# Patient Record
Sex: Male | Born: 1970 | Race: Black or African American | Hispanic: No | Marital: Married | Smoking: Light tobacco smoker
Health system: Southern US, Community
[De-identification: ages and names within clinical notes are randomized; demographics above are authoritative.]

## PROBLEM LIST (undated history)

## (undated) DIAGNOSIS — J45909 Unspecified asthma, uncomplicated: Secondary | ICD-10-CM

## (undated) DIAGNOSIS — I493 Ventricular premature depolarization: Secondary | ICD-10-CM

## (undated) DIAGNOSIS — I10 Essential (primary) hypertension: Secondary | ICD-10-CM

## (undated) DIAGNOSIS — R809 Proteinuria, unspecified: Secondary | ICD-10-CM

## (undated) HISTORY — DX: Unspecified asthma, uncomplicated: J45.909

## (undated) HISTORY — DX: Ventricular premature depolarization: I49.3

## (undated) HISTORY — DX: Essential (primary) hypertension: I10

## (undated) HISTORY — DX: Proteinuria, unspecified: R80.9

---

## 2002-09-06 ENCOUNTER — Encounter: Payer: Self-pay | Admitting: *Deleted

## 2002-09-06 ENCOUNTER — Emergency Department (HOSPITAL_COMMUNITY): Admission: EM | Admit: 2002-09-06 | Discharge: 2002-09-06 | Payer: Self-pay | Admitting: Emergency Medicine

## 2005-10-01 ENCOUNTER — Emergency Department (HOSPITAL_COMMUNITY): Admission: EM | Admit: 2005-10-01 | Discharge: 2005-10-01 | Payer: Self-pay | Admitting: Emergency Medicine

## 2014-10-16 ENCOUNTER — Other Ambulatory Visit: Payer: Self-pay | Admitting: Nephrology

## 2014-10-16 DIAGNOSIS — I159 Secondary hypertension, unspecified: Secondary | ICD-10-CM

## 2014-10-16 DIAGNOSIS — R809 Proteinuria, unspecified: Secondary | ICD-10-CM

## 2014-10-16 DIAGNOSIS — N182 Chronic kidney disease, stage 2 (mild): Secondary | ICD-10-CM

## 2014-10-27 ENCOUNTER — Other Ambulatory Visit: Payer: Self-pay

## 2014-11-03 ENCOUNTER — Ambulatory Visit
Admission: RE | Admit: 2014-11-03 | Discharge: 2014-11-03 | Disposition: A | Discharge status: Home / Self Care | Payer: BLUE CROSS/BLUE SHIELD | Source: Ambulatory Visit | Attending: Nephrology | Admitting: Nephrology

## 2014-11-03 DIAGNOSIS — R809 Proteinuria, unspecified: Secondary | ICD-10-CM

## 2014-11-03 DIAGNOSIS — N182 Chronic kidney disease, stage 2 (mild): Secondary | ICD-10-CM

## 2014-11-03 DIAGNOSIS — I159 Secondary hypertension, unspecified: Secondary | ICD-10-CM

## 2015-10-21 DIAGNOSIS — R809 Proteinuria, unspecified: Secondary | ICD-10-CM | POA: Diagnosis not present

## 2015-10-21 DIAGNOSIS — I10 Essential (primary) hypertension: Secondary | ICD-10-CM | POA: Diagnosis not present

## 2015-10-21 DIAGNOSIS — N182 Chronic kidney disease, stage 2 (mild): Secondary | ICD-10-CM | POA: Diagnosis not present

## 2015-10-26 DIAGNOSIS — Z13220 Encounter for screening for lipoid disorders: Secondary | ICD-10-CM | POA: Diagnosis not present

## 2015-10-26 DIAGNOSIS — J4520 Mild intermittent asthma, uncomplicated: Secondary | ICD-10-CM | POA: Diagnosis not present

## 2015-10-26 DIAGNOSIS — Z131 Encounter for screening for diabetes mellitus: Secondary | ICD-10-CM | POA: Diagnosis not present

## 2015-10-26 DIAGNOSIS — I493 Ventricular premature depolarization: Secondary | ICD-10-CM | POA: Diagnosis not present

## 2015-10-26 DIAGNOSIS — I10 Essential (primary) hypertension: Secondary | ICD-10-CM | POA: Diagnosis not present

## 2015-10-26 DIAGNOSIS — Z0000 Encounter for general adult medical examination without abnormal findings: Secondary | ICD-10-CM | POA: Diagnosis not present

## 2015-11-03 ENCOUNTER — Telehealth: Payer: Self-pay | Admitting: Cardiology

## 2015-11-03 NOTE — Telephone Encounter (Signed)
Received records from [facility] for appointment on 11/11/15 with Dr Herbie BaltimoreHarding.  [facility] for Dr Elissa HeftyHarding's schedule on 11/11/15.

## 2015-11-11 ENCOUNTER — Encounter: Payer: Self-pay | Admitting: Cardiology

## 2015-11-11 ENCOUNTER — Ambulatory Visit (INDEPENDENT_AMBULATORY_CARE_PROVIDER_SITE_OTHER): Payer: BLUE CROSS/BLUE SHIELD | Admitting: Cardiology

## 2015-11-11 VITALS — BP 110/86 | HR 72 | Ht 68.0 in | Wt 211.8 lb

## 2015-11-11 DIAGNOSIS — I10 Essential (primary) hypertension: Secondary | ICD-10-CM

## 2015-11-11 DIAGNOSIS — I493 Ventricular premature depolarization: Secondary | ICD-10-CM

## 2015-11-11 DIAGNOSIS — R9431 Abnormal electrocardiogram [ECG] [EKG]: Secondary | ICD-10-CM | POA: Diagnosis not present

## 2015-11-11 NOTE — Patient Instructions (Signed)
NO CHANGE WITH CURRENT MEDICATIONS    Your physician has requested that you have an echocardiogram AT [address]. Echocardiography is a painless test that uses sound waves to create images of your heart. It provides your doctor with information about the size and shape of your heart and how well your heart's chambers and valves are working. This procedure takes approximately one hour. There are no restrictions for this procedure.   IF TEST IS NORMAL- APPOINTMENT AS NEEDED.  Your physician recommends that you schedule a follow-up appointment in AFTER ECHO IS COMPLETED.

## 2015-11-11 NOTE — Progress Notes (Signed)
PCP: Pcp Not In System; Dr. Docia Chuck Alfredo Bach)  Clinic Note: Chief Complaint  Patient presents with  . New Evaluation    Consultation for PVCs    HPI: Shawn Peterson is a 45 y.o. male with a PMH below who presents today for Evaluation of a symptomatically PVC's.Shawn Peterson was seen on May 16 by his PCP team follow-up. He is noted to have a PVC on his EKG.  Recent Hospitalizations: None  Studies Reviewed: None  Interval History: Shawn Peterson is a very healthy young appearing gentleman who has a history of hypertension on single medication as well as asthma on Ventolin. He usually works out extensively, but because work is not able to exercise as much she used to be. He usually lifts weights and plays tennis. Despite that he is denies any cardiac symptoms of chest tightness pressure dyspnea. He denies any sensation of palpitations or irregular heartbeats. No PND orthopnea or edema. No syncope/near syncope or amaurosis fugax.   He indicates that he really is not sure why he is here.  No claudication.  ROS: A comprehensive was performed. Review of Systems  Constitutional: Negative for fever, chills and malaise/fatigue.  HENT: Negative for nosebleeds.   Respiratory: Negative for cough, shortness of breath and wheezing.   Cardiovascular: Negative for palpitations.  Gastrointestinal: Negative for abdominal pain, blood in stool and melena.  Genitourinary:       Has history of proteinuria. Is on lisinopril  Musculoskeletal: Negative for myalgias, joint pain and falls.  Neurological: Negative for dizziness, loss of consciousness and headaches.  Endo/Heme/Allergies: Does not bruise/bleed easily.  Psychiatric/Behavioral: Negative.  Negative for memory loss.  All other systems reviewed and are negative.   Past Medical History  Diagnosis Date  . Hypertension   . PVC (premature ventricular contraction)   . Asthma   . Proteinuria     Followed by Dr. Kathrene Bongo from  nephrology    History reviewed. No pertinent past surgical history.  Prior to Admission medications   Medication Sig Start Date End Date Taking? Authorizing Provider  lisinopril-hydrochlorothiazide (PRINZIDE,ZESTORETIC) 10-12.5 MG tablet Take 1 tablet by mouth daily. 10/17/15   Historical Provider, MD  VENTOLIN HFA 108 (90 Base) MCG/ACT inhaler Inhale 1 puff into the lungs as needed. 10/17/15   Historical Provider, MD   No Known Allergies   Social History   Social History  . Marital Status: Married    Spouse Name: N/A  . Number of Children: N/A  . Years of Education: N/A   Social History Main Topics  . Smoking status: Former Games developer  . Smokeless tobacco: None  . Alcohol Use: None  . Drug Use: None  . Sexual Activity: Not Asked   Other Topics Concern  . None   Social History Narrative   family history includes Asthma in his father; Heart attack in his maternal grandmother; Hypertension in his mother; Pancreatic cancer in his paternal grandmother; Prostate cancer in his maternal grandfather.   Wt Readings from Last 3 Encounters:  11/11/15 211 lb 12.8 oz (96.072 kg)    PHYSICAL EXAM BP 110/86 mmHg  Pulse 72  Ht  (1.727 m)  Wt 211 lb 12.8 oz (96.072 kg)  BMI 32.21 kg/m2  SpO2 96% General appearance: alert, cooperative, appears stated age, no distress and Very healthy appearing. Muscular - BMI does not indicate obesity HEENT: [place]/AT, EOMI, MMM, anicteric sclera Neck: no adenopathy, no carotid bruit and no JVD Lungs: clear to auscultation bilaterally, normal percussion bilaterally and  non-labored Heart: RRR with occasional ectopy, S1&S2 normal, no murmur, click, rub or gallop; nondisplaced PMI Abdomen: soft, non-tender; bowel sounds normal; no masses,  no organomegaly; no HJR Extremities: extremities normal, atraumatic, no cyanosis, or edema Pulses: 2+ and symmetric;  Skin: mobility and turgor normal and no lesions noted  Neurologic: Mental status: Alert, oriented,  thought content appropriate Cranial nerves: normal (II-XII grossly intact)   Adult ECG Report  Rate: 72 ;  Rhythm: normal sinus rhythm and sinus arrhythmia; Early repolarization abnormality  Narrative Interpretation: Relatively normal EKG  Review of EKG from PCP: sinus rhythm with occasional PVC otherwise normal axis, intervals and durations  Other studies Reviewed: Additional studies/ records that were reviewed today include:  Recent Labs:  Labs from PCP reviewed Total cholesterol 185, HDL 50, LDL 116, TG 90.6     ASSESSMENT / PLAN: Problem List Items Addressed This Visit    PVCs (premature ventricular contractions) - Primary    A symptomatically PVCs. I heard maybe 1 or 2. Not noted on EKG. With no symptoms to suggest angina or heart failure related dyspnea, no indication for stress test. With him having hypertension to the extent of having proteinuria, we will check a 2-D echocardiogram to exclude structural abnormality.  Provided this is normal, he probably does not need to continue following up with cardiology. Would not use beta blocker unless he was manic.      Relevant Medications   lisinopril-hydrochlorothiazide (PRINZIDE,ZESTORETIC) 10-12.5 MG tablet   Other Relevant Orders   EKG 12-Lead   ECHOCARDIOGRAM COMPLETE   Essential hypertension (Chronic)    Well-controlled. Followed by PCP and nephrology      Relevant Medications   lisinopril-hydrochlorothiazide (PRINZIDE,ZESTORETIC) 10-12.5 MG tablet   Abnormal finding on EKG   Relevant Orders   EKG 12-Lead   ECHOCARDIOGRAM COMPLETE      Current medicines are reviewed at length with the patient today. (+/- concerns) none The following changes have been made: none Studies Ordered:   Orders Placed This Encounter  Procedures  . EKG 12-Lead  . ECHOCARDIOGRAM COMPLETE   IF TEST IS NORMAL- APPOINTMENT AS NEEDED.   Bryan Lemmaavid Harding, M.D., M.S. Interventional Cardiologist   Pager # [phone] Phone #  [phone] [address] [place], [place] [id number]

## 2015-11-13 ENCOUNTER — Encounter: Payer: Self-pay | Admitting: Cardiology

## 2015-11-13 DIAGNOSIS — I493 Ventricular premature depolarization: Secondary | ICD-10-CM | POA: Insufficient documentation

## 2015-11-13 DIAGNOSIS — I10 Essential (primary) hypertension: Secondary | ICD-10-CM | POA: Insufficient documentation

## 2015-11-13 DIAGNOSIS — R9431 Abnormal electrocardiogram [ECG] [EKG]: Secondary | ICD-10-CM | POA: Insufficient documentation

## 2015-11-13 NOTE — Assessment & Plan Note (Signed)
A symptomatically PVCs. I heard maybe 1 or 2. Not noted on EKG. With no symptoms to suggest angina or heart failure related dyspnea, no indication for stress test. With him having hypertension to the extent of having proteinuria, we will check a 2-D echocardiogram to exclude structural abnormality.  Provided this is normal, he probably does not need to continue following up with cardiology. Would not use beta blocker unless he was manic.

## 2015-11-13 NOTE — Assessment & Plan Note (Signed)
Well-controlled. Followed by PCP and nephrology

## 2015-12-02 ENCOUNTER — Other Ambulatory Visit: Payer: Self-pay

## 2015-12-02 ENCOUNTER — Ambulatory Visit (HOSPITAL_COMMUNITY): Payer: BLUE CROSS/BLUE SHIELD | Attending: Cardiology

## 2015-12-02 DIAGNOSIS — I10 Essential (primary) hypertension: Secondary | ICD-10-CM | POA: Diagnosis not present

## 2015-12-02 DIAGNOSIS — R9431 Abnormal electrocardiogram [ECG] [EKG]: Secondary | ICD-10-CM | POA: Diagnosis not present

## 2015-12-02 DIAGNOSIS — I493 Ventricular premature depolarization: Secondary | ICD-10-CM | POA: Diagnosis not present

## 2015-12-02 DIAGNOSIS — I77810 Thoracic aortic ectasia: Secondary | ICD-10-CM | POA: Diagnosis not present

## 2015-12-02 DIAGNOSIS — I059 Rheumatic mitral valve disease, unspecified: Secondary | ICD-10-CM | POA: Insufficient documentation

## 2015-12-02 DIAGNOSIS — Z87891 Personal history of nicotine dependence: Secondary | ICD-10-CM | POA: Insufficient documentation

## 2015-12-02 LAB — ECHOCARDIOGRAM COMPLETE
Ao-asc: 26 cm
E decel time: 176 msec
EERAT: 5.55
FS: 33 % (ref 28–44)
IVS/LV PW RATIO, ED: 0.89
LA ID, A-P, ES: 27 mm
LA diam end sys: 27 mm
LA diam index: 1.29 cm/m2
LA vol A4C: 48.4 ml
LA vol index: 28.6 mL/m2
LA vol: 59.7 mL
LV E/e'average: 5.55
LV e' LATERAL: 14.2 cm/s
LVEEMED: 5.55
LVOT SV: 73 mL
LVOT VTI: 23.1 cm
LVOT area: 3.14 cm2
LVOT diameter: 20 mm
LVOTPV: 107 cm/s
MV Dec: 176
MV Peak grad: 2 mmHg
MV pk A vel: 51.1 m/s
MV pk E vel: 78.8 m/s
PW: 8.98 mm — AB (ref 0.6–1.1)
Reg peak vel: 220 cm/s
TAPSE: 16.5 mm
TDI e' lateral: 14.2
TDI e' medial: 8.11
TR max vel: 220 cm/s

## 2015-12-10 ENCOUNTER — Telehealth: Payer: Self-pay | Admitting: *Deleted

## 2015-12-10 NOTE — Telephone Encounter (Signed)
LEFT MESSAGE TO CALL BACK

## 2015-12-10 NOTE — Telephone Encounter (Signed)
-----   Message from Marykay Lexavid W Harding, MD sent at 12/03/2015 12:22 AM EDT ----- Overall. The echocardiogram is relatively normal. The left ventricle function is on the low end of normal, but still normal. Probably showing signs of long-standing high blood pressure. Otherwise normal valve function. Normal right-sided pressures.  Overall good news.  Bryan Lemmaavid Harding, MD

## 2016-04-27 DIAGNOSIS — R809 Proteinuria, unspecified: Secondary | ICD-10-CM | POA: Diagnosis not present

## 2016-04-27 DIAGNOSIS — Z6831 Body mass index (BMI) 31.0-31.9, adult: Secondary | ICD-10-CM | POA: Diagnosis not present

## 2016-04-27 DIAGNOSIS — N182 Chronic kidney disease, stage 2 (mild): Secondary | ICD-10-CM | POA: Diagnosis not present

## 2016-04-27 DIAGNOSIS — I10 Essential (primary) hypertension: Secondary | ICD-10-CM | POA: Diagnosis not present

## 2016-05-01 IMAGING — US US RENAL
1 series · 14 of 25 positions shown · non-contrast
Comparison: 11/13/2006

CLINICAL DATA: Chronic renal disease and proteinuria

EXAM:
RENAL / URINARY TRACT ULTRASOUND COMPLETE

[Series 1: us renal · 0.23mm/px · 14 of 27 slices shown]
[im 1/27]
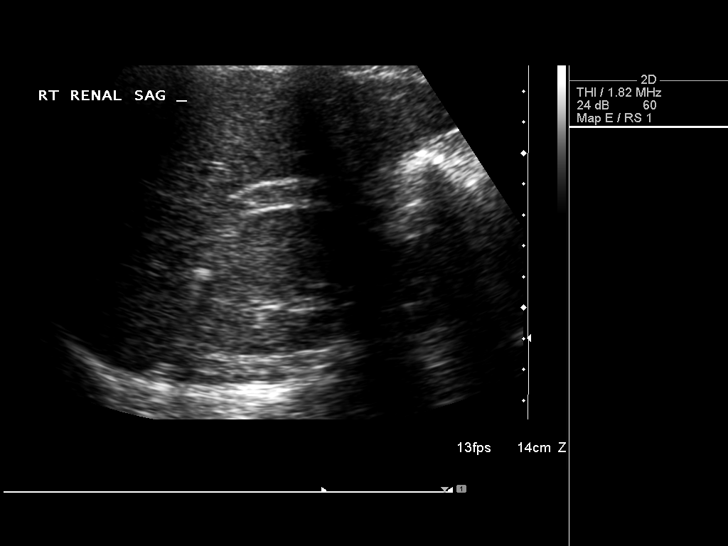
[im 3/27]
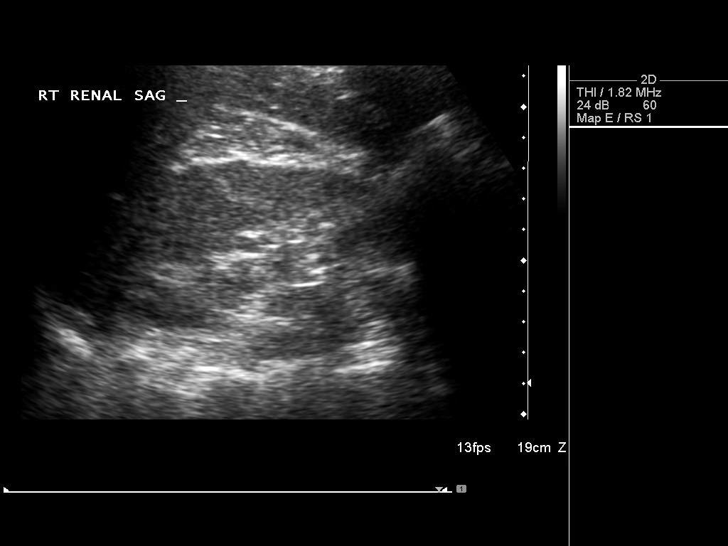
[im 5/27]
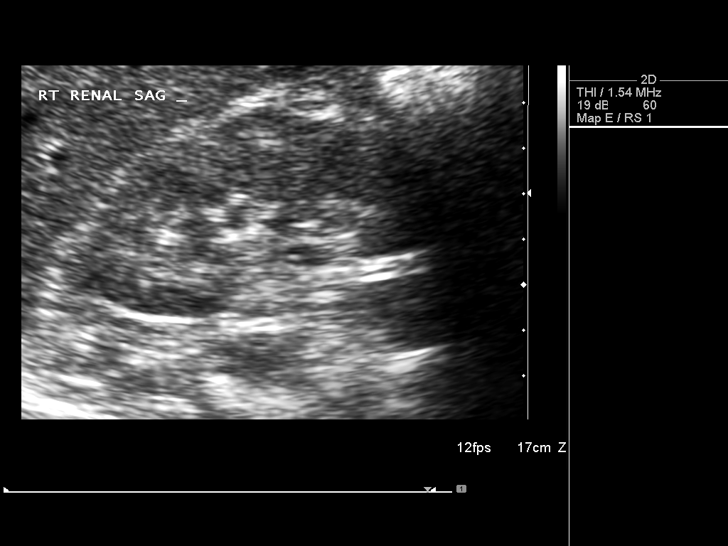
[im 7/27]
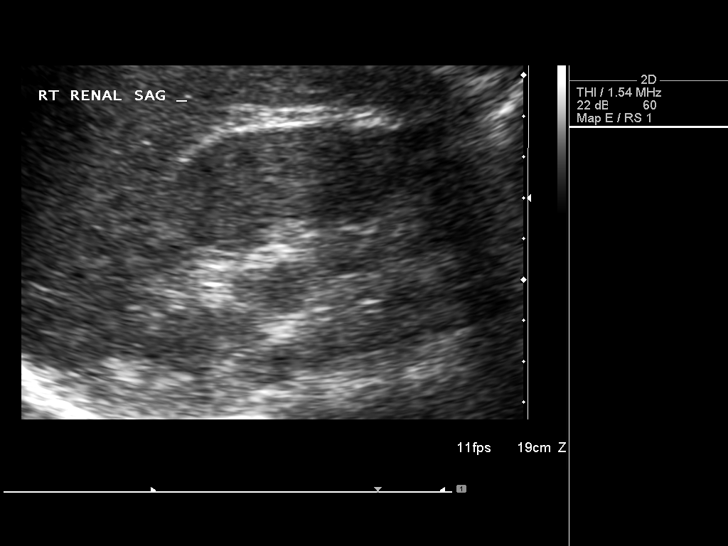
[im 9/27]
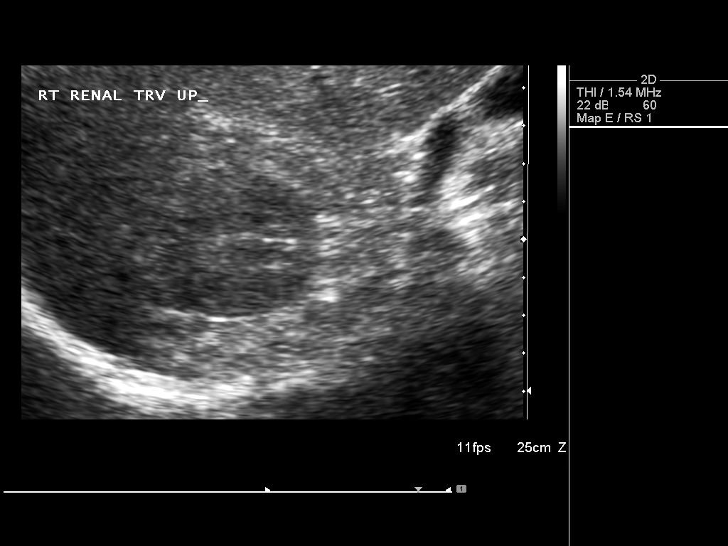
[im 10/27]
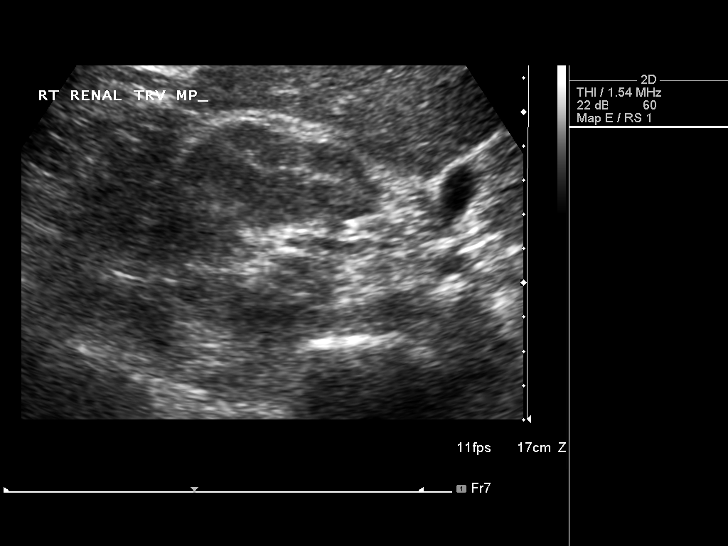
[im 12/27]
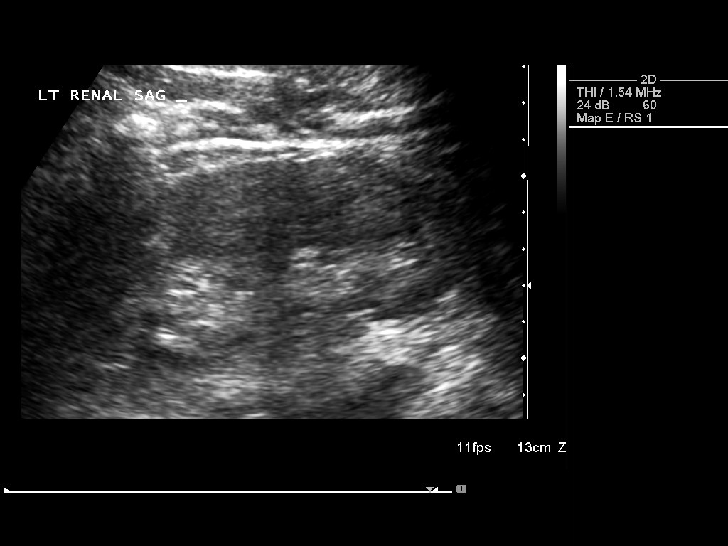
[im 15/27]
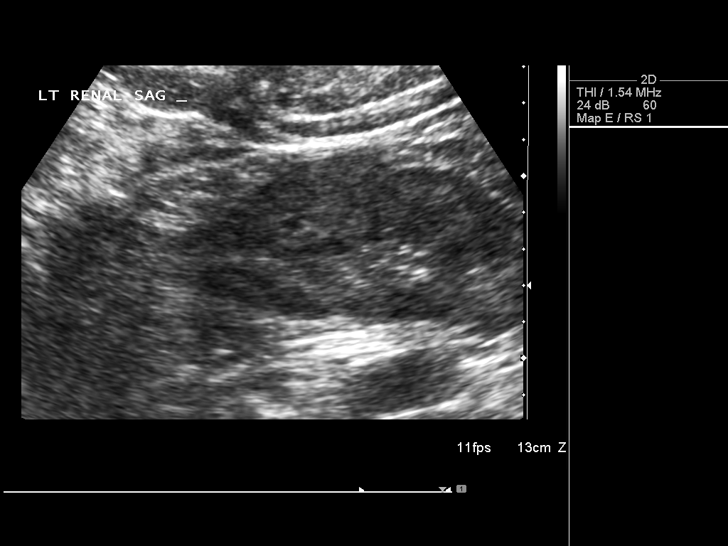
[im 17/27]
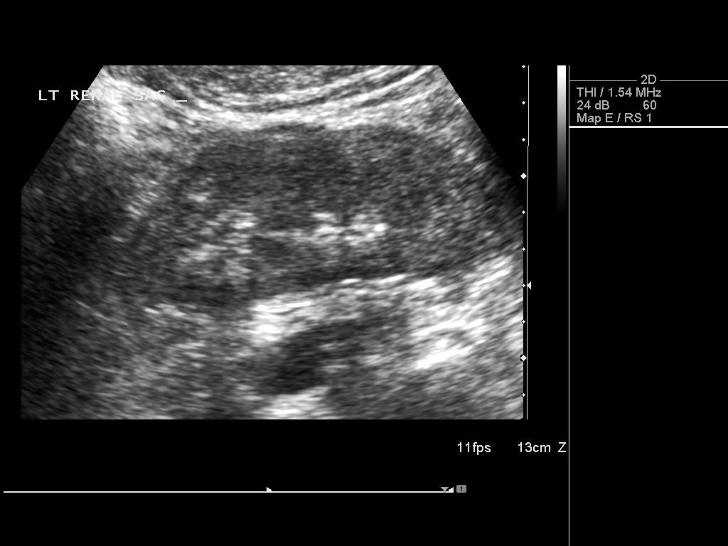
[im 18/27]
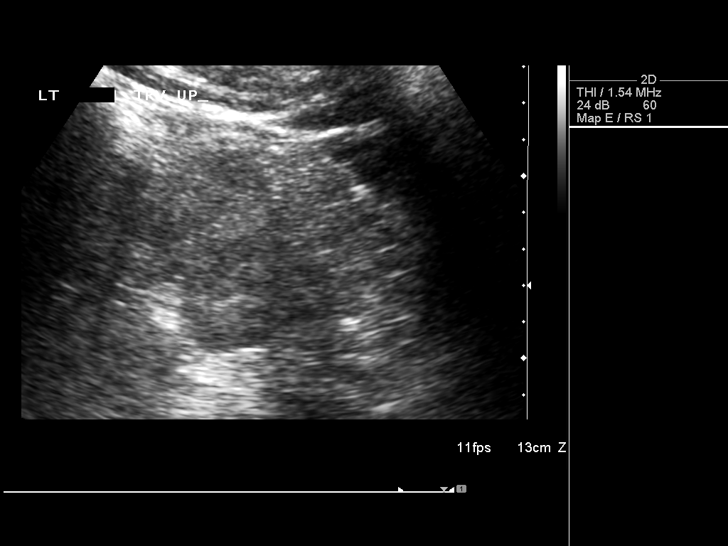
[im 20/27]
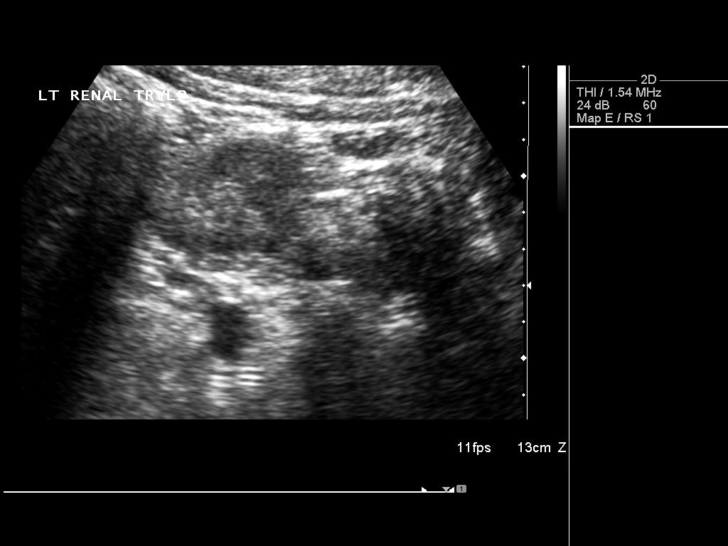
[im 22/27]
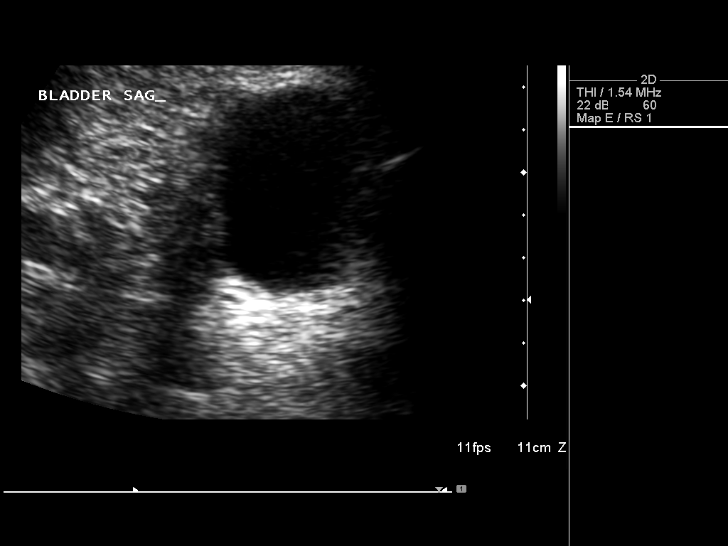
[im 24/27]
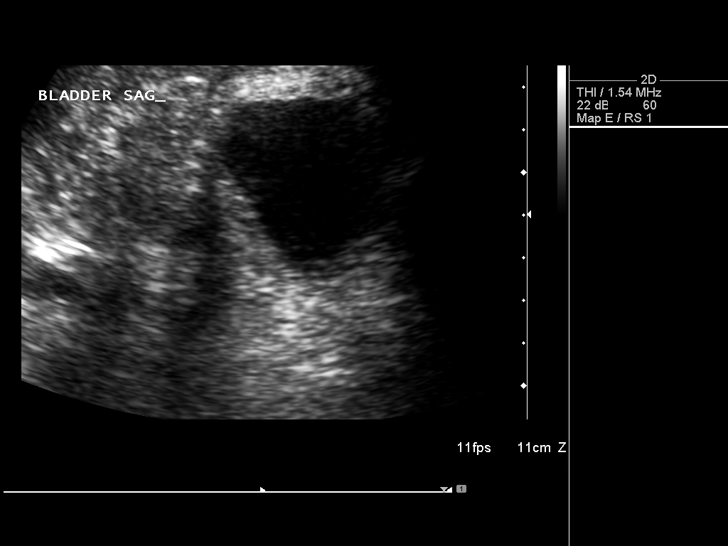
[im 27/27]
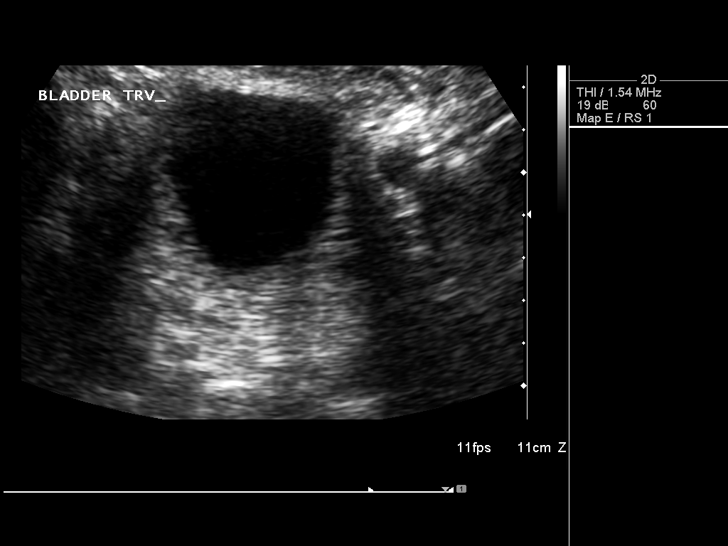

[14 of 25 positions shown; findings below may reference images not displayed]

FINDINGS: Right Kidney:

Length: 10.1 cm. Generalized increased echogenicity is noted. No
focal mass lesion or hydronephrosis is noted.

Left Kidney:

Length: 11.3 cm. Generalized increased echogenicity is noted. No
mass lesion or hydronephrosis is noted.

Bladder:

Appears normal for degree of bladder distention.
IMPRESSION: Increased echogenicity consistent with medical renal disease.

No acute abnormality noted.

## 2016-10-31 DIAGNOSIS — Z125 Encounter for screening for malignant neoplasm of prostate: Secondary | ICD-10-CM | POA: Diagnosis not present

## 2016-10-31 DIAGNOSIS — I10 Essential (primary) hypertension: Secondary | ICD-10-CM | POA: Diagnosis not present

## 2016-10-31 DIAGNOSIS — Z13220 Encounter for screening for lipoid disorders: Secondary | ICD-10-CM | POA: Diagnosis not present

## 2016-10-31 DIAGNOSIS — Z0000 Encounter for general adult medical examination without abnormal findings: Secondary | ICD-10-CM | POA: Diagnosis not present

## 2016-10-31 DIAGNOSIS — J4520 Mild intermittent asthma, uncomplicated: Secondary | ICD-10-CM | POA: Diagnosis not present

## 2016-11-09 DIAGNOSIS — Z6831 Body mass index (BMI) 31.0-31.9, adult: Secondary | ICD-10-CM | POA: Diagnosis not present

## 2016-11-09 DIAGNOSIS — N182 Chronic kidney disease, stage 2 (mild): Secondary | ICD-10-CM | POA: Diagnosis not present

## 2016-11-09 DIAGNOSIS — R809 Proteinuria, unspecified: Secondary | ICD-10-CM | POA: Diagnosis not present

## 2016-11-09 DIAGNOSIS — I10 Essential (primary) hypertension: Secondary | ICD-10-CM | POA: Diagnosis not present

## 2017-11-06 DIAGNOSIS — Z13220 Encounter for screening for lipoid disorders: Secondary | ICD-10-CM | POA: Diagnosis not present

## 2017-11-06 DIAGNOSIS — Z23 Encounter for immunization: Secondary | ICD-10-CM | POA: Diagnosis not present

## 2017-11-06 DIAGNOSIS — I493 Ventricular premature depolarization: Secondary | ICD-10-CM | POA: Diagnosis not present

## 2017-11-06 DIAGNOSIS — I10 Essential (primary) hypertension: Secondary | ICD-10-CM | POA: Diagnosis not present

## 2017-11-06 DIAGNOSIS — Z0000 Encounter for general adult medical examination without abnormal findings: Secondary | ICD-10-CM | POA: Diagnosis not present

## 2017-11-06 DIAGNOSIS — J4520 Mild intermittent asthma, uncomplicated: Secondary | ICD-10-CM | POA: Diagnosis not present

## 2017-11-06 DIAGNOSIS — Z125 Encounter for screening for malignant neoplasm of prostate: Secondary | ICD-10-CM | POA: Diagnosis not present

## 2017-11-22 DIAGNOSIS — N183 Chronic kidney disease, stage 3 (moderate): Secondary | ICD-10-CM | POA: Diagnosis not present

## 2017-11-27 DIAGNOSIS — R809 Proteinuria, unspecified: Secondary | ICD-10-CM | POA: Diagnosis not present

## 2017-11-27 DIAGNOSIS — I10 Essential (primary) hypertension: Secondary | ICD-10-CM | POA: Diagnosis not present

## 2017-11-27 DIAGNOSIS — Z6831 Body mass index (BMI) 31.0-31.9, adult: Secondary | ICD-10-CM | POA: Diagnosis not present

## 2017-11-27 DIAGNOSIS — N182 Chronic kidney disease, stage 2 (mild): Secondary | ICD-10-CM | POA: Diagnosis not present

## 2018-10-12 ENCOUNTER — Emergency Department (HOSPITAL_COMMUNITY)
Admission: EM | Admit: 2018-10-12 | Discharge: 2018-10-12 | Disposition: A | Discharge status: Home / Self Care | Payer: BLUE CROSS/BLUE SHIELD | Attending: Emergency Medicine | Admitting: Emergency Medicine

## 2018-10-12 ENCOUNTER — Telehealth (HOSPITAL_COMMUNITY): Payer: Self-pay

## 2018-10-12 ENCOUNTER — Encounter (HOSPITAL_COMMUNITY): Payer: Self-pay | Admitting: Emergency Medicine

## 2018-10-12 ENCOUNTER — Other Ambulatory Visit: Payer: Self-pay

## 2018-10-12 DIAGNOSIS — R197 Diarrhea, unspecified: Secondary | ICD-10-CM | POA: Insufficient documentation

## 2018-10-12 DIAGNOSIS — I10 Essential (primary) hypertension: Secondary | ICD-10-CM | POA: Insufficient documentation

## 2018-10-12 DIAGNOSIS — R42 Dizziness and giddiness: Secondary | ICD-10-CM | POA: Diagnosis not present

## 2018-10-12 DIAGNOSIS — R55 Syncope and collapse: Secondary | ICD-10-CM | POA: Insufficient documentation

## 2018-10-12 DIAGNOSIS — R112 Nausea with vomiting, unspecified: Secondary | ICD-10-CM | POA: Diagnosis not present

## 2018-10-12 DIAGNOSIS — R0902 Hypoxemia: Secondary | ICD-10-CM | POA: Diagnosis not present

## 2018-10-12 DIAGNOSIS — R7989 Other specified abnormal findings of blood chemistry: Secondary | ICD-10-CM

## 2018-10-12 DIAGNOSIS — F17290 Nicotine dependence, other tobacco product, uncomplicated: Secondary | ICD-10-CM | POA: Insufficient documentation

## 2018-10-12 DIAGNOSIS — R609 Edema, unspecified: Secondary | ICD-10-CM | POA: Diagnosis not present

## 2018-10-12 LAB — GASTROINTESTINAL PANEL BY PCR, STOOL (REPLACES STOOL CULTURE)

## 2018-10-12 LAB — BASIC METABOLIC PANEL
Anion gap: 11 (ref 5–15)
Anion gap: 9 (ref 5–15)
BUN: 22 mg/dL — ABNORMAL HIGH (ref 6–20)
BUN: 19 mg/dL (ref 6–20)
CO2: 17 mmol/L — ABNORMAL LOW (ref 22–32)
CO2: 19 mmol/L — ABNORMAL LOW (ref 22–32)
Calcium: 9.6 mg/dL (ref 8.9–10.3)
Calcium: 8.5 mg/dL — ABNORMAL LOW (ref 8.9–10.3)
Chloride: 109 mmol/L (ref 98–111)
Chloride: 110 mmol/L (ref 98–111)
Creatinine, Ser: 2.49 mg/dL — ABNORMAL HIGH (ref 0.61–1.24)
Creatinine, Ser: 2.08 mg/dL — ABNORMAL HIGH (ref 0.61–1.24)
GFR calc Af Amer: 34 mL/min — ABNORMAL LOW (ref >60)
GFR calc Af Amer: 43 mL/min — ABNORMAL LOW (ref >60)
GFR calc non Af Amer: 30 mL/min — ABNORMAL LOW (ref >60)
GFR calc non Af Amer: 37 mL/min — ABNORMAL LOW (ref >60)
Glucose, Bld: 122 mg/dL — ABNORMAL HIGH (ref 70–99)
Glucose, Bld: 106 mg/dL — ABNORMAL HIGH (ref 70–99)
Potassium: 5.3 mmol/L — ABNORMAL HIGH (ref 3.5–5.1)
Potassium: 5.2 mmol/L — ABNORMAL HIGH (ref 3.5–5.1)
Sodium: 137 mmol/L (ref 135–145)
Sodium: 138 mmol/L (ref 135–145)

## 2018-10-12 LAB — CBC WITH DIFFERENTIAL/PLATELET
Abs Immature Granulocytes: 0.09 10*3/uL — ABNORMAL HIGH (ref 0.00–0.07)
Basophils Absolute: 0 10*3/uL (ref 0.0–0.1)
Basophils Relative: 0 %
Eosinophils Absolute: 0 10*3/uL (ref 0.0–0.5)
Eosinophils Relative: 0 %
HCT: 45.4 % (ref 39.0–52.0)
Hemoglobin: 14.5 g/dL (ref 13.0–17.0)
Immature Granulocytes: 1 %
Lymphocytes Relative: 4 %
Lymphs Abs: 0.7 10*3/uL (ref 0.7–4.0)
MCH: 28.9 pg (ref 26.0–34.0)
MCHC: 31.9 g/dL (ref 30.0–36.0)
MCV: 90.6 fL (ref 80.0–100.0)
Monocytes Absolute: 1.1 10*3/uL — ABNORMAL HIGH (ref 0.1–1.0)
Monocytes Relative: 7 %
Neutro Abs: 14.2 10*3/uL — ABNORMAL HIGH (ref 1.7–7.7)
Neutrophils Relative %: 88 %
Platelets: 310 10*3/uL (ref 150–400)
RBC: 5.01 MIL/uL (ref 4.22–5.81)
RDW: 12.2 % (ref 11.5–15.5)
WBC: 16.1 10*3/uL — ABNORMAL HIGH (ref 4.0–10.5)
nRBC: 0 % (ref 0.0–0.2)

## 2018-10-12 LAB — TROPONIN I: Troponin I: <0.03 ng/mL (ref <0.03)

## 2018-10-12 MED ORDER — SODIUM CHLORIDE 0.9 % IV BOLUS
1000.0000 mL | Freq: Once | INTRAVENOUS | Status: AC
  Administered 2018-10-12: 03:00:00 1000 mL via INTRAVENOUS

## 2018-10-12 MED ORDER — SODIUM CHLORIDE 0.9 % IV BOLUS
1000.0000 mL | Freq: Once | INTRAVENOUS | Status: AC
  Administered 2018-10-12: 05:00:00 1000 mL via INTRAVENOUS

## 2018-10-12 MED ORDER — ONDANSETRON 4 MG PO TBDP: 4.0000 mg | ORAL_TABLET | Freq: Three times a day (TID) | ORAL | 0 refills | Status: AC | PRN

## 2018-10-12 MED ORDER — SODIUM CHLORIDE 0.9 % IV BOLUS
1000.0000 mL | Freq: Once | INTRAVENOUS | Status: AC
  Administered 2018-10-12: 09:00:00 1000 mL via INTRAVENOUS

## 2018-10-12 NOTE — ED Notes (Signed)
Pt verbalizes understanding of discharge instructions. IV removed and site is clean and dry. Wheelchair provided and pt was taken to lobby with no incident.

## 2018-10-12 NOTE — ED Triage Notes (Signed)
Pt from home with N/V/D x 2-3 days. Pt wife reports he passed out. EMS arrived and while taking EKG pt was talking and reporting he was feeling dizzy and he had another syncopal episode with HR down to the 30s. Per EMS when they laid pt to the floor he immediately became alert. EMS gave fluids and BP, and HR has since remained WNL. Pt denies CP, Abdominal pain, and cough. Pt has hx of HTN, Asthma

## 2018-10-12 NOTE — ED Notes (Signed)
Pt understand a stool sample is needed for evaluation. Pt states he does not feel the urge to have a BM

## 2018-10-12 NOTE — ED Notes (Signed)
Nurse collected labs. 

## 2018-10-12 NOTE — Discharge Instructions (Signed)
You need to have your kidney function rechecked in 1 week.  Stay hydrated over the next week.  Drink plenty of fluids.  If you run a fever, have severe abdominal pain, or any worsening of your symptoms, please return to the emergency department.

## 2018-10-12 NOTE — ED Provider Notes (Signed)
GI panel resulted. Advised charge nurse to call in prescription for ciprofloxacin 500 mg BID for 3 days under my name.     Raeford Razor, MD 10/12/18 1431

## 2018-10-12 NOTE — ED Provider Notes (Signed)
[facility] Provider Note   CSN: [id number] Arrival date & time: 10/12/18  0024    History   Chief Complaint Chief Complaint  Patient presents with  . Loss of Consciousness    HPI Ames CoupeRonald Dietze is a 48 y.o. male.     Patient presents to the emergency department with a chief complaint of syncope.  He states that he has had severe diarrhea over the past 2 days.  He reports some associated nausea and vomiting.  He denies any recent travel.  Denies eating any new foods, but does note that the symptoms started after eating a [employer] sandwich.  He denies any blood in his stool.  Denies any fevers chills.  Denies any abdominal pain.  He states that tonight he stood up after using the bathroom and walk to his bedroom, where he became lightheaded and passed out.  EMS was called, and while he is being evaluated by EMS he again felt lightheaded and they laid him down on the ground, and he says he passed out again.  He denies any pain in his chest.  Denies any shortness of breath.  Denies any other associated symptoms.  The history is provided by the patient. No language interpreter was used.    Past Medical History:  Diagnosis Date  . Asthma   . Hypertension   . Proteinuria    Followed by Dr. Kathrene [place]ongo[place] from nephrology  . PVC (premature ventricular contraction)     Patient Active Problem List   Diagnosis Date Noted  . PVCs (premature ventricular contractions) 11/13/2015  . Abnormal finding on EKG 11/13/2015  . Essential hypertension 11/13/2015    No past surgical history on file.      Home Medications    Prior to Admission medications   Medication Sig Start Date End Date Taking? Authorizing Provider  lisinopril-hydrochlorothiazide (PRINZIDE,ZESTORETIC) 10-12.5 MG tablet Take 1 tablet by mouth daily. 10/17/15   [provider]  VENTOLIN HFA 108 (90 Base) MCG/ACT inhaler Inhale 1 puff into the lungs as needed. 10/17/15   [provider]    Family History Family History  Problem Relation Age of Onset  . Hypertension Mother   . Asthma Father   . Heart attack Maternal Grandmother   . Prostate cancer Maternal Grandfather   . Pancreatic cancer Paternal Grandmother     Social History Social History   Tobacco Use  . Smoking status: Light Tobacco Smoker    Types: Cigars  . Tobacco comment: occasional cigar  Substance Use Topics  . Alcohol use: Not on file  . Drug use: Never     Allergies   Patient has no known allergies.   Review of Systems Review of Systems  All other systems reviewed and are negative.    Physical Exam Updated Vital Signs BP 132/70   Pulse 84   Resp (!) 22   Ht 5\' 8"  (1.727 m)   Wt 95.7 kg   SpO2 100%   BMI 32.08 kg/m   Physical Exam Vitals signs and nursing note reviewed.  Constitutional:      Appearance: He is well-developed.  HENT:     Head: Normocephalic and atraumatic.  Eyes:     Conjunctiva/sclera: Conjunctivae normal.  Neck:     Musculoskeletal: Neck supple.  Cardiovascular:     Rate and Rhythm: Normal rate and regular rhythm.     Heart sounds: No murmur.  Pulmonary:     Effort: Pulmonary effort is normal. No respiratory  distress.     Breath sounds: Normal breath sounds.  Abdominal:     Palpations: Abdomen is soft.     Tenderness: There is no abdominal tenderness.     Comments: No focal abdominal tenderness, no RLQ tenderness or pain at McBurney's point, no RUQ tenderness or Murphy's sign, no left-sided abdominal tenderness, no fluid wave, or signs of peritonitis   Musculoskeletal: Normal range of motion.  Skin:    General: Skin is warm and dry.  Neurological:     Mental Status: He is alert and oriented to person, place, and time.  Psychiatric:        Mood and Affect: Mood normal.        Behavior: Behavior normal.        Thought Content: Thought content normal.        Judgment: Judgment normal.      ED Treatments / Results  Labs (all  labs ordered are listed, but only abnormal results are displayed) Labs Reviewed  CBC WITH DIFFERENTIAL/PLATELET - Abnormal; Notable for the following components:      Result Value   WBC 16.1 (*)    Neutro Abs 14.2 (*)    Monocytes Absolute 1.1 (*)    Abs Immature Granulocytes 0.09 (*)    All other components within normal limits  BASIC METABOLIC PANEL - Abnormal; Notable for the following components:   Potassium 5.3 (*)    CO2 17 (*)    Glucose, Bld 122 (*)    BUN 22 (*)    Creatinine, Ser 2.49 (*)    GFR calc non Af Amer 30 (*)    GFR calc Af Amer 34 (*)    All other components within normal limits  GASTROINTESTINAL PANEL BY PCR, STOOL (REPLACES STOOL CULTURE)  TROPONIN I  BASIC METABOLIC PANEL    EKG None  Radiology No results found.  Procedures Procedures (including critical care time)  Medications Ordered in ED Medications - No data to display   Initial Impression / Assessment and Plan / ED Course  I have reviewed the triage vital signs and the nursing notes.  Pertinent labs & imaging results that were available during my care of the patient were reviewed by me and considered in my medical decision making (see chart for details).        Patient with diarrhea for the past 2 days.  Has had some nausea and vomiting as well.  Symptoms started after eating [employer].  Patient's abdomen is soft and nontender.  He is afebrile.  Denies any bloody diarrhea.  Patient does have a leukocytosis to 16.1.  His K is 5.3, and creatinine is 2.49.  Patient states that he does see nephrologist, and has been told that he has protein in his urine, but does not know what his kidney function is.  Case discussed with Dr. Clayborne Dana, who recommends 2 fluid boluses and repeat BMP.  If K and creatinine are improving, patient can be discharged with close follow-up and instructions to hydrate very well.    Return precautions given.  Patient signed out to [place], PA-C, who will continue care.   Final Clinical Impressions(s) / ED Diagnoses   Final diagnoses:  Nausea vomiting and diarrhea  Elevated serum creatinine    ED Discharge Orders    None       Roxy Horseman, PA-C 10/12/18 0654    Mesner, Barbara Cower, MD 10/12/18 [phone]

## 2018-12-03 DIAGNOSIS — Z0000 Encounter for general adult medical examination without abnormal findings: Secondary | ICD-10-CM | POA: Diagnosis not present

## 2018-12-05 DIAGNOSIS — N183 Chronic kidney disease, stage 3 (moderate): Secondary | ICD-10-CM | POA: Diagnosis not present

## 2018-12-05 DIAGNOSIS — Z8249 Family history of ischemic heart disease and other diseases of the circulatory system: Secondary | ICD-10-CM | POA: Diagnosis not present

## 2018-12-05 DIAGNOSIS — I10 Essential (primary) hypertension: Secondary | ICD-10-CM | POA: Diagnosis not present

## 2018-12-05 DIAGNOSIS — Z6833 Body mass index (BMI) 33.0-33.9, adult: Secondary | ICD-10-CM | POA: Diagnosis not present

## 2018-12-05 DIAGNOSIS — Z125 Encounter for screening for malignant neoplasm of prostate: Secondary | ICD-10-CM | POA: Diagnosis not present

## 2018-12-05 DIAGNOSIS — Z0000 Encounter for general adult medical examination without abnormal findings: Secondary | ICD-10-CM | POA: Diagnosis not present

## 2019-01-28 DIAGNOSIS — I10 Essential (primary) hypertension: Secondary | ICD-10-CM | POA: Diagnosis not present

## 2019-01-28 DIAGNOSIS — N182 Chronic kidney disease, stage 2 (mild): Secondary | ICD-10-CM | POA: Diagnosis not present

## 2019-01-28 DIAGNOSIS — Z6831 Body mass index (BMI) 31.0-31.9, adult: Secondary | ICD-10-CM | POA: Diagnosis not present

## 2019-01-28 DIAGNOSIS — R809 Proteinuria, unspecified: Secondary | ICD-10-CM | POA: Diagnosis not present

## 2019-03-11 DIAGNOSIS — Z23 Encounter for immunization: Secondary | ICD-10-CM | POA: Diagnosis not present

## 2019-06-07 DIAGNOSIS — R112 Nausea with vomiting, unspecified: Secondary | ICD-10-CM | POA: Diagnosis not present

## 2019-06-07 DIAGNOSIS — K529 Noninfective gastroenteritis and colitis, unspecified: Secondary | ICD-10-CM | POA: Diagnosis not present

## 2019-06-07 DIAGNOSIS — B9620 Unspecified Escherichia coli [E. coli] as the cause of diseases classified elsewhere: Secondary | ICD-10-CM | POA: Diagnosis not present

## 2019-06-07 DIAGNOSIS — R197 Diarrhea, unspecified: Secondary | ICD-10-CM | POA: Diagnosis not present

## 2019-06-07 DIAGNOSIS — R109 Unspecified abdominal pain: Secondary | ICD-10-CM | POA: Diagnosis not present

## 2019-12-02 DIAGNOSIS — I10 Essential (primary) hypertension: Secondary | ICD-10-CM | POA: Diagnosis not present

## 2019-12-02 DIAGNOSIS — R7401 Elevation of levels of liver transaminase levels: Secondary | ICD-10-CM | POA: Diagnosis not present

## 2019-12-02 DIAGNOSIS — J4520 Mild intermittent asthma, uncomplicated: Secondary | ICD-10-CM | POA: Diagnosis not present

## 2019-12-02 DIAGNOSIS — Z13220 Encounter for screening for lipoid disorders: Secondary | ICD-10-CM | POA: Diagnosis not present

## 2019-12-02 DIAGNOSIS — N1831 Chronic kidney disease, stage 3a: Secondary | ICD-10-CM | POA: Diagnosis not present

## 2019-12-02 DIAGNOSIS — Z0000 Encounter for general adult medical examination without abnormal findings: Secondary | ICD-10-CM | POA: Diagnosis not present

## 2019-12-08 DIAGNOSIS — Z0000 Encounter for general adult medical examination without abnormal findings: Secondary | ICD-10-CM | POA: Diagnosis not present

## 2020-01-06 DIAGNOSIS — R7401 Elevation of levels of liver transaminase levels: Secondary | ICD-10-CM | POA: Diagnosis not present

## 2020-01-29 DIAGNOSIS — N182 Chronic kidney disease, stage 2 (mild): Secondary | ICD-10-CM | POA: Diagnosis not present

## 2020-01-29 DIAGNOSIS — I10 Essential (primary) hypertension: Secondary | ICD-10-CM | POA: Diagnosis not present

## 2020-01-29 DIAGNOSIS — R809 Proteinuria, unspecified: Secondary | ICD-10-CM | POA: Diagnosis not present

## 2020-01-29 DIAGNOSIS — Z6831 Body mass index (BMI) 31.0-31.9, adult: Secondary | ICD-10-CM | POA: Diagnosis not present

## 2020-02-12 DIAGNOSIS — R195 Other fecal abnormalities: Secondary | ICD-10-CM | POA: Diagnosis not present

## 2020-03-04 DIAGNOSIS — Z23 Encounter for immunization: Secondary | ICD-10-CM | POA: Diagnosis not present

## 2020-03-31 DIAGNOSIS — Z1159 Encounter for screening for other viral diseases: Secondary | ICD-10-CM | POA: Diagnosis not present

## 2020-04-05 DIAGNOSIS — D124 Benign neoplasm of descending colon: Secondary | ICD-10-CM | POA: Diagnosis not present

## 2020-04-05 DIAGNOSIS — R195 Other fecal abnormalities: Secondary | ICD-10-CM | POA: Diagnosis not present

## 2020-04-05 DIAGNOSIS — D123 Benign neoplasm of transverse colon: Secondary | ICD-10-CM | POA: Diagnosis not present

## 2020-04-05 DIAGNOSIS — K648 Other hemorrhoids: Secondary | ICD-10-CM | POA: Diagnosis not present

## 2020-04-05 DIAGNOSIS — D122 Benign neoplasm of ascending colon: Secondary | ICD-10-CM | POA: Diagnosis not present

## 2020-04-05 DIAGNOSIS — D125 Benign neoplasm of sigmoid colon: Secondary | ICD-10-CM | POA: Diagnosis not present

## 2020-04-05 DIAGNOSIS — K635 Polyp of colon: Secondary | ICD-10-CM | POA: Diagnosis not present

## 2020-04-05 DIAGNOSIS — K5730 Diverticulosis of large intestine without perforation or abscess without bleeding: Secondary | ICD-10-CM | POA: Diagnosis not present

## 2020-07-27 DIAGNOSIS — Z6831 Body mass index (BMI) 31.0-31.9, adult: Secondary | ICD-10-CM | POA: Diagnosis not present

## 2020-07-27 DIAGNOSIS — N182 Chronic kidney disease, stage 2 (mild): Secondary | ICD-10-CM | POA: Diagnosis not present

## 2020-07-27 DIAGNOSIS — I10 Essential (primary) hypertension: Secondary | ICD-10-CM | POA: Diagnosis not present

## 2020-07-27 DIAGNOSIS — R809 Proteinuria, unspecified: Secondary | ICD-10-CM | POA: Diagnosis not present

## 2021-02-22 DIAGNOSIS — Z6831 Body mass index (BMI) 31.0-31.9, adult: Secondary | ICD-10-CM | POA: Diagnosis not present

## 2021-02-22 DIAGNOSIS — I10 Essential (primary) hypertension: Secondary | ICD-10-CM | POA: Diagnosis not present

## 2021-02-22 DIAGNOSIS — N182 Chronic kidney disease, stage 2 (mild): Secondary | ICD-10-CM | POA: Diagnosis not present

## 2021-02-22 DIAGNOSIS — Z23 Encounter for immunization: Secondary | ICD-10-CM | POA: Diagnosis not present

## 2021-02-22 DIAGNOSIS — R809 Proteinuria, unspecified: Secondary | ICD-10-CM | POA: Diagnosis not present

## 2021-03-22 DIAGNOSIS — J4520 Mild intermittent asthma, uncomplicated: Secondary | ICD-10-CM | POA: Diagnosis not present

## 2021-03-22 DIAGNOSIS — I10 Essential (primary) hypertension: Secondary | ICD-10-CM | POA: Diagnosis not present

## 2021-03-22 DIAGNOSIS — Z125 Encounter for screening for malignant neoplasm of prostate: Secondary | ICD-10-CM | POA: Diagnosis not present

## 2021-03-22 DIAGNOSIS — Z0000 Encounter for general adult medical examination without abnormal findings: Secondary | ICD-10-CM | POA: Diagnosis not present

## 2021-06-24 DIAGNOSIS — N182 Chronic kidney disease, stage 2 (mild): Secondary | ICD-10-CM | POA: Diagnosis not present

## 2021-08-23 DIAGNOSIS — N182 Chronic kidney disease, stage 2 (mild): Secondary | ICD-10-CM | POA: Diagnosis not present

## 2021-08-23 DIAGNOSIS — Z6831 Body mass index (BMI) 31.0-31.9, adult: Secondary | ICD-10-CM | POA: Diagnosis not present

## 2021-08-23 DIAGNOSIS — R809 Proteinuria, unspecified: Secondary | ICD-10-CM | POA: Diagnosis not present

## 2021-08-23 DIAGNOSIS — I10 Essential (primary) hypertension: Secondary | ICD-10-CM | POA: Diagnosis not present

## 2021-09-27 DIAGNOSIS — D120 Benign neoplasm of cecum: Secondary | ICD-10-CM | POA: Diagnosis not present

## 2021-09-27 DIAGNOSIS — K648 Other hemorrhoids: Secondary | ICD-10-CM | POA: Diagnosis not present

## 2021-09-27 DIAGNOSIS — K5730 Diverticulosis of large intestine without perforation or abscess without bleeding: Secondary | ICD-10-CM | POA: Diagnosis not present

## 2021-09-27 DIAGNOSIS — D125 Benign neoplasm of sigmoid colon: Secondary | ICD-10-CM | POA: Diagnosis not present

## 2021-09-27 DIAGNOSIS — Z86010 Personal history of colonic polyps: Secondary | ICD-10-CM | POA: Diagnosis not present

## 2021-09-27 DIAGNOSIS — D123 Benign neoplasm of transverse colon: Secondary | ICD-10-CM | POA: Diagnosis not present

## 2022-03-28 DIAGNOSIS — Z131 Encounter for screening for diabetes mellitus: Secondary | ICD-10-CM | POA: Diagnosis not present

## 2022-03-28 DIAGNOSIS — Z0000 Encounter for general adult medical examination without abnormal findings: Secondary | ICD-10-CM | POA: Diagnosis not present

## 2022-03-28 DIAGNOSIS — Z125 Encounter for screening for malignant neoplasm of prostate: Secondary | ICD-10-CM | POA: Diagnosis not present

## 2022-03-28 DIAGNOSIS — Z23 Encounter for immunization: Secondary | ICD-10-CM | POA: Diagnosis not present

## 2022-03-28 DIAGNOSIS — I10 Essential (primary) hypertension: Secondary | ICD-10-CM | POA: Diagnosis not present

## 2022-04-11 DIAGNOSIS — R809 Proteinuria, unspecified: Secondary | ICD-10-CM | POA: Diagnosis not present

## 2022-04-11 DIAGNOSIS — N182 Chronic kidney disease, stage 2 (mild): Secondary | ICD-10-CM | POA: Diagnosis not present

## 2022-04-11 DIAGNOSIS — I10 Essential (primary) hypertension: Secondary | ICD-10-CM | POA: Diagnosis not present

## 2022-10-17 DIAGNOSIS — I129 Hypertensive chronic kidney disease with stage 1 through stage 4 chronic kidney disease, or unspecified chronic kidney disease: Secondary | ICD-10-CM | POA: Diagnosis not present

## 2022-10-17 DIAGNOSIS — N182 Chronic kidney disease, stage 2 (mild): Secondary | ICD-10-CM | POA: Diagnosis not present

## 2022-10-17 DIAGNOSIS — Z6831 Body mass index (BMI) 31.0-31.9, adult: Secondary | ICD-10-CM | POA: Diagnosis not present

## 2022-10-17 DIAGNOSIS — R809 Proteinuria, unspecified: Secondary | ICD-10-CM | POA: Diagnosis not present

## 2022-11-23 DIAGNOSIS — N182 Chronic kidney disease, stage 2 (mild): Secondary | ICD-10-CM | POA: Diagnosis not present

## 2023-04-12 DIAGNOSIS — Z13220 Encounter for screening for lipoid disorders: Secondary | ICD-10-CM | POA: Diagnosis not present

## 2023-04-12 DIAGNOSIS — Z131 Encounter for screening for diabetes mellitus: Secondary | ICD-10-CM | POA: Diagnosis not present

## 2023-04-12 DIAGNOSIS — Z23 Encounter for immunization: Secondary | ICD-10-CM | POA: Diagnosis not present

## 2023-04-12 DIAGNOSIS — I10 Essential (primary) hypertension: Secondary | ICD-10-CM | POA: Diagnosis not present

## 2023-04-12 DIAGNOSIS — Z0000 Encounter for general adult medical examination without abnormal findings: Secondary | ICD-10-CM | POA: Diagnosis not present

## 2023-04-12 DIAGNOSIS — Z125 Encounter for screening for malignant neoplasm of prostate: Secondary | ICD-10-CM | POA: Diagnosis not present

## 2023-04-24 DIAGNOSIS — R809 Proteinuria, unspecified: Secondary | ICD-10-CM | POA: Diagnosis not present

## 2023-04-24 DIAGNOSIS — I129 Hypertensive chronic kidney disease with stage 1 through stage 4 chronic kidney disease, or unspecified chronic kidney disease: Secondary | ICD-10-CM | POA: Diagnosis not present

## 2023-04-24 DIAGNOSIS — N183 Chronic kidney disease, stage 3 unspecified: Secondary | ICD-10-CM | POA: Diagnosis not present

## 2023-10-23 DIAGNOSIS — N183 Chronic kidney disease, stage 3 unspecified: Secondary | ICD-10-CM | POA: Diagnosis not present

## 2023-10-23 DIAGNOSIS — I129 Hypertensive chronic kidney disease with stage 1 through stage 4 chronic kidney disease, or unspecified chronic kidney disease: Secondary | ICD-10-CM | POA: Diagnosis not present

## 2023-10-23 DIAGNOSIS — R809 Proteinuria, unspecified: Secondary | ICD-10-CM | POA: Diagnosis not present

## 2024-04-22 DIAGNOSIS — Z125 Encounter for screening for malignant neoplasm of prostate: Secondary | ICD-10-CM | POA: Diagnosis not present

## 2024-04-22 DIAGNOSIS — Z13220 Encounter for screening for lipoid disorders: Secondary | ICD-10-CM | POA: Diagnosis not present

## 2024-04-22 DIAGNOSIS — I10 Essential (primary) hypertension: Secondary | ICD-10-CM | POA: Diagnosis not present

## 2024-04-22 DIAGNOSIS — Z6834 Body mass index (BMI) 34.0-34.9, adult: Secondary | ICD-10-CM | POA: Diagnosis not present

## 2024-04-22 DIAGNOSIS — Z23 Encounter for immunization: Secondary | ICD-10-CM | POA: Diagnosis not present

## 2024-04-22 DIAGNOSIS — Z0000 Encounter for general adult medical examination without abnormal findings: Secondary | ICD-10-CM | POA: Diagnosis not present

## 2024-04-22 DIAGNOSIS — Z131 Encounter for screening for diabetes mellitus: Secondary | ICD-10-CM | POA: Diagnosis not present

## 2024-04-22 DIAGNOSIS — I493 Ventricular premature depolarization: Secondary | ICD-10-CM | POA: Diagnosis not present

## 2024-04-29 DIAGNOSIS — N183 Chronic kidney disease, stage 3 unspecified: Secondary | ICD-10-CM | POA: Diagnosis not present

## 2024-04-29 DIAGNOSIS — R809 Proteinuria, unspecified: Secondary | ICD-10-CM | POA: Diagnosis not present

## 2024-04-29 DIAGNOSIS — I129 Hypertensive chronic kidney disease with stage 1 through stage 4 chronic kidney disease, or unspecified chronic kidney disease: Secondary | ICD-10-CM | POA: Diagnosis not present
